# Patient Record
Sex: Female | Born: 1990
Health system: Southern US, Community
[De-identification: ages and names within clinical notes are randomized; demographics above are authoritative.]

## PROBLEM LIST (undated history)

## (undated) ENCOUNTER — Inpatient Hospital Stay (HOSPITAL_COMMUNITY): Payer: Self-pay

## (undated) DIAGNOSIS — E079 Disorder of thyroid, unspecified: Secondary | ICD-10-CM

## (undated) DIAGNOSIS — E039 Hypothyroidism, unspecified: Secondary | ICD-10-CM

## (undated) DIAGNOSIS — F419 Anxiety disorder, unspecified: Secondary | ICD-10-CM

## (undated) DIAGNOSIS — T7840XA Allergy, unspecified, initial encounter: Secondary | ICD-10-CM

## (undated) DIAGNOSIS — E786 Lipoprotein deficiency: Secondary | ICD-10-CM

## (undated) HISTORY — DX: Allergy, unspecified, initial encounter: T78.40XA

## (undated) HISTORY — DX: Anxiety disorder, unspecified: F41.9

## (undated) HISTORY — PX: CHOLECYSTECTOMY: SHX55

## (undated) HISTORY — DX: Hypothyroidism, unspecified: E03.9

---
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
## 2014-02-05 ENCOUNTER — Emergency Department (HOSPITAL_COMMUNITY)
Admission: EM | Admit: 2014-02-05 | Discharge: 2014-02-05 | Disposition: A | Discharge status: Home / Self Care | Payer: 59 | Source: Home / Self Care | Attending: Family Medicine | Admitting: Family Medicine

## 2014-02-05 ENCOUNTER — Encounter (HOSPITAL_COMMUNITY): Payer: Self-pay | Admitting: Emergency Medicine

## 2014-02-05 DIAGNOSIS — L03319 Cellulitis of trunk, unspecified: Secondary | ICD-10-CM

## 2014-02-05 DIAGNOSIS — L02219 Cutaneous abscess of trunk, unspecified: Secondary | ICD-10-CM

## 2014-02-05 DIAGNOSIS — T148 Other injury of unspecified body region: Secondary | ICD-10-CM

## 2014-02-05 DIAGNOSIS — W57XXXA Bitten or stung by nonvenomous insect and other nonvenomous arthropods, initial encounter: Secondary | ICD-10-CM

## 2014-02-05 HISTORY — DX: Disorder of thyroid, unspecified: E07.9

## 2014-02-05 MED ORDER — CEPHALEXIN 500 MG PO CAPS: 500.0000 mg | ORAL_CAPSULE | Freq: Two times a day (BID) | ORAL | Status: AC

## 2014-02-05 NOTE — ED Provider Notes (Signed)
Medical screening examination/treatment/procedure(s) were performed by non-physician practitioner and as supervising physician I was immediately available for consultation/collaboration.  Leslee Home, M.D.  Reuben Likes, MD 02/05/14 [id number]

## 2014-02-05 NOTE — ED Provider Notes (Signed)
CSN: [id number]     Arrival date & time 02/05/14  1739 History   First MD Initiated Contact with Patient 02/05/14 1748     Chief Complaint  Patient presents with  . Insect Bite   (Consider location/radiation/quality/duration/timing/severity/associated sxs/prior Treatment) HPI Comments: Patient presents with concerns of infection. She was bitten by something last week. In the last 2 days she has noticed worsening warmth and erythema around the area of the bite. Some tenderness. No swelling. No fever or chills.   The history is provided by the patient.    Past Medical History  Diagnosis Date  . Thyroid disease    History reviewed. No pertinent past surgical history. History reviewed. No pertinent family history. History  Substance Use Topics  . Smoking status: Never Smoker   . Smokeless tobacco: Not on file  . Alcohol Use: Yes   OB History   Grav Para Term Preterm Abortions TAB SAB Ect Mult Living                 Review of Systems  All other systems reviewed and are negative.   Allergies  Review of patient's allergies indicates no known allergies.  Home Medications   Prior to Admission medications   Medication Sig Start Date End Date Taking? Authorizing Provider  Thyroid (NATURE-THROID PO) Take by mouth.   Yes Historical Provider, MD  cephALEXin (KEFLEX) 500 MG capsule Take 1 capsule (500 mg total) by mouth 2 (two) times daily. 02/05/14 02/10/14  Riki Sheer, PA-C   BP 149/93  Pulse 85  Temp(Src) 97.9 F (36.6 C) (Oral)  Resp 16  SpO2 98%  LMP 01/15/2014 Physical Exam  Nursing note and vitals reviewed. Constitutional: She is oriented to person, place, and time. She appears well-developed and well-nourished. No distress.  HENT:  Head: Normocephalic and atraumatic.  Pulmonary/Chest: Effort normal.  Neurological: She is alert and oriented to person, place, and time.  Skin: Skin is warm. She is not diaphoretic.  6cm erythematous area surrounding bite mark, warm  to touch. No fluctuance or drainage. Mildly tender to palpaiton  Psychiatric: Her behavior is normal.    ED Course  Procedures (including critical care time) Labs Review Labs Reviewed - No data to display  Imaging Review No results found.   MDM   1. Cellulitis of trunk, unspecified site   2. Insect bite    MILD-Empiric coverage because of >4cm of erythema and warmth.  Cover with Keflex. Follow if worsens. Keep clean.    Riki Sheer, PA-C 02/05/14 1815

## 2014-02-05 NOTE — Discharge Instructions (Signed)

## 2014-02-05 NOTE — ED Notes (Signed)
Reports hiking last week.  noitced two days ago possible insect bite to right lower leg with redness and swelling.  Area kept clean and pt has been using neosporin.

## 2014-02-14 ENCOUNTER — Ambulatory Visit: Payer: Self-pay | Admitting: Family Medicine

## 2014-03-02 ENCOUNTER — Ambulatory Visit (INDEPENDENT_AMBULATORY_CARE_PROVIDER_SITE_OTHER): Payer: 59 | Admitting: Family Medicine

## 2014-03-02 ENCOUNTER — Encounter: Payer: Self-pay | Admitting: Family Medicine

## 2014-03-02 VITALS — BP 108/70 | HR 79 | Temp 97.2°F | Ht 65.75 in | Wt 187.6 lb

## 2014-03-02 DIAGNOSIS — Z7689 Persons encountering health services in other specified circumstances: Secondary | ICD-10-CM

## 2014-03-02 DIAGNOSIS — E119 Type 2 diabetes mellitus without complications: Secondary | ICD-10-CM

## 2014-03-02 DIAGNOSIS — F411 Generalized anxiety disorder: Secondary | ICD-10-CM

## 2014-03-02 DIAGNOSIS — E039 Hypothyroidism, unspecified: Secondary | ICD-10-CM

## 2014-03-02 DIAGNOSIS — Z7189 Other specified counseling: Secondary | ICD-10-CM

## 2014-03-02 LAB — HEMOGLOBIN A1C: Hgb A1c MFr Bld: 5.4 % (ref 4.6–6.5)

## 2014-03-02 NOTE — Progress Notes (Signed)
No chief complaint on file.   HPI:  Angela Miller is here to establish care.  Last PCP and physical:  Has the following chronic problems and concerns today:  There are no active problems to display for this patient.  Hypothyroidism: -mild, she has labs - TSH was in 5-6 range T4, T4 levels normal -had hair thinning, brittle nails, anxiety issues, fatigue - all improved on treatment -seen at [facility] -they started her on nature-thyroid- she is not sure what dose -FH of hypothyroid in mom, cousin and aunts  Very Mild Diabetes: - taking trichromium - working on diet and exercise  Anxiety: -generalized, does not tolerate change in schedule/etc well -social anxiety as well Restlessness, on edge:  sometimes Easily Fatigued: no Difficulty concentrating: occ Irritability: occ Muscle tension: constant Sleep Disturbance: yes, can't shut of brain -mom took xanax, she took this in highschool    ROS negative for unless reported above: fevers, unintentional weight loss, hearing or vision loss, chest pain, palpitations, struggling to breath, hemoptysis, melena, hematochezia, hematuria, falls, loc, si, thoughts of self harm  Past Medical History  Diagnosis Date  . Thyroid disease   . Hypothyroidism     Past Surgical History  Procedure Laterality Date  . Cholecystectomy  2009    Family History  Problem Relation Age of Onset  . Thyroid disease Mother   . Mental retardation Mother     depression  . Hypertension Father   . Thyroid disease Maternal Aunt   . Diabetes Maternal Grandmother   . Diabetes Maternal Grandfather   . Heart disease Maternal Grandfather   . Cancer Paternal Grandmother     lung    History   Social History  . Marital Status: Single    Spouse Name: N/A    Number of Children: N/A  . Years of Education: N/A   Social History Main Topics  . Smoking status: Never Smoker   . Smokeless tobacco: None  . Alcohol Use: Yes  . Drug  Use: No  . Sexual Activity: None   Other Topics Concern  . None   Social History Narrative   Work or School: Engineer, civil (consulting)nurse, Education officer, environmentalalamance      Home Situation: lives with spouse      Spiritual Beliefs: none      Lifestyle: regular exercise, healthy diet                Current outpatient prescriptions:NON FORMULARY, daily. Trichromium, Disp: , Rfl: ;  NON FORMULARY, Pregnololone, Disp: , Rfl: ;  Thyroid (NATURE-THROID PO), Take by mouth., Disp: , Rfl: ;  VITAMIN D, ERGOCALCIFEROL, PO, Take by mouth daily., Disp: , Rfl:   EXAM:  Filed Vitals:   03/02/14 1410  BP: 108/70  Pulse: 79  Temp: 97.2 F (36.2 C)    Body mass index is 30.51 kg/(m^2).  GENERAL: vitals reviewed and listed above, alert, oriented, appears well hydrated and in no acute distress  HEENT: atraumatic, conjunttiva clear, no obvious abnormalities on inspection of external nose and ears  NECK: no obvious masses on inspection  LUNGS: clear to auscultation bilaterally, no wheezes, rales or rhonchi, good air movement  CV: HRRR, no peripheral edema  MS: moves all extremities without noticeable abnormality  PSYCH: pleasant and cooperative, no obvious depression or anxiety  ASSESSMENT AND PLAN:  Discussed the following assessment and plan:  Hypothyroidism, unspecified hypothyroidism type - Plan: TSH  Type 2 diabetes mellitus without complication - Plan: Hemoglobin A1c  Encounter to establish care  Generalized anxiety disorder  -check TSH, try levothyroxine low dose, follow up 6 weeks -counsleing, discussed meds, she may try SSRI but wants to try counseling first, advised I don't rx benzos -We reviewed the PMH, PSH, FH, SH, Meds and Allergies. -We provided refills for any medications we will prescribe as needed. -We addressed current concerns per orders and patient instructions. -We have asked for records for pertinent exams, studies, vaccines and notes from previous providers. -We have advised patient to  follow up per instructions below.   -Patient advised to return or notify a doctor immediately if symptoms worsen or persist or new concerns arise.  There are no Patient Instructions on file for this visit.   Kriste BasqueKIM, Angela Sinning R.

## 2014-03-02 NOTE — Patient Instructions (Signed)
BEFORE YOU LEAVE: -labs -schedule follow up in 6 weeks  Get counseling  -We have ordered labs or studies at this visit. It can take up to 1-2 weeks for results and processing. We will contact you with instructions IF your results are abnormal. Normal results will be released to your [facility]. If you have not heard from us or can not find your results in [facility] in 2 weeks please contact our office.  -PLEASE SIGN UP FOR MYCHART TODAY   We recommend the following healthy lifestyle measures: - eat a healthy diet consisting of lots of vegetables, fruits, beans, nuts, seeds, healthy meats such as white chicken and fish and whole grains.  - avoid fried foods, fast food, processed foods, sodas, red meet and other fattening foods.  - get a least 150 minutes of aerobic exercise per week.

## 2014-03-02 NOTE — Progress Notes (Signed)
Pre visit review using our clinic review tool, if applicable. No additional management support is needed unless otherwise documented below in the visit note. 

## 2014-03-03 ENCOUNTER — Telehealth: Payer: Self-pay | Admitting: Family Medicine

## 2014-03-03 LAB — TSH: TSH: 2.9 u[IU]/mL (ref 0.35–4.50)

## 2014-03-03 MED ORDER — LEVOTHYROXINE SODIUM 50 MCG PO TABS: 50.0000 ug | ORAL_TABLET | Freq: Every day | ORAL | Status: DC

## 2014-03-03 NOTE — Telephone Encounter (Signed)
Patient informed and she is aware the Rx was sent to her pharmacy and stated she will have the lab test done at her next appt with Dr Selena BattenKim.  She was informed of the HGA1C level also.

## 2014-03-03 NOTE — Telephone Encounter (Signed)
Please send synthroid to take on empty stomach daily if wants to try the synthroid INSTEAD of the NATURETHROID. Would advise recheck TSH in 6 weeks. Send 30 with 3 refills.

## 2014-03-03 NOTE — Telephone Encounter (Signed)
Pt  States Dr. Selena BattenKim was suppose to send a RX in for synthroid but nothing was sent and pt would like to know what her A1C lab results are.  [facility] - [place], [place] - [address]

## 2014-04-13 ENCOUNTER — Ambulatory Visit (INDEPENDENT_AMBULATORY_CARE_PROVIDER_SITE_OTHER): Payer: 59 | Admitting: Family Medicine

## 2014-04-13 ENCOUNTER — Encounter: Payer: Self-pay | Admitting: Family Medicine

## 2014-04-13 VITALS — BP 100/60 | HR 80 | Temp 98.1°F | Ht 65.75 in | Wt 187.0 lb

## 2014-04-13 DIAGNOSIS — E039 Hypothyroidism, unspecified: Secondary | ICD-10-CM

## 2014-04-13 DIAGNOSIS — F411 Generalized anxiety disorder: Secondary | ICD-10-CM

## 2014-04-13 DIAGNOSIS — Z8639 Personal history of other endocrine, nutritional and metabolic disease: Secondary | ICD-10-CM

## 2014-04-13 LAB — TSH: TSH: 3.09 u[IU]/mL (ref 0.35–4.50)

## 2014-04-13 NOTE — Progress Notes (Signed)
HPI:  There are no active problems to display for this patient.  Hypothyroidism: -mild, she has labs - TSH was in 5-6 range T4, T4 levels normal - TSH normal on recheck, transitioning to low dose levothyroxine reports doing great and feels well -seen at [facility] in the past and they started her on nature-thyroid -denies: skin, nail issues, constipation, palpitations, decreased energy -FH of hypothyroid in mom, cousin and aunts  ? Hx Prediabetes: - taking trichromium -HgbA1c 5.4 on our check - working on diet and exercise No polyuria or polydipsia  Anxiety: -generalized, does not tolerate change in schedule/etc well -social anxiety as well -discussed options for tx and she opted for counseling first and possibly SSRI -reports: she is actually doing pretty well, is still planning to do counseling Restlessness, on edge: sometimes Easily Fatigued: no Difficulty concentrating: occ Irritability: occ Muscle tension: improving Sleep Disturbance: yes, can't shut of brain -mom took xanax, she took this in highschool   ROS: See pertinent positives and negatives per HPI.  Past Medical History  Diagnosis Date  . Thyroid disease   . Hypothyroidism     Past Surgical History  Procedure Laterality Date  . Cholecystectomy  2009    Family History  Problem Relation Age of Onset  . Thyroid disease Mother   . Mental retardation Mother     depression  . Hypertension Father   . Thyroid disease Maternal Aunt   . Diabetes Maternal Grandmother   . Diabetes Maternal Grandfather   . Heart disease Maternal Grandfather   . Cancer Paternal Grandmother     lung    History   Social History  . Marital Status: Single    Spouse Name: N/A    Number of Children: N/A  . Years of Education: N/A   Social History Main Topics  . Smoking status: Never Smoker   . Smokeless tobacco: None  . Alcohol Use: Yes  . Drug Use: No  . Sexual Activity: None   Other Topics  Concern  . None   Social History Narrative   Work or School: Engineer, civil (consulting)nurse, Education officer, environmentalalamance      Home Situation: lives with spouse      Spiritual Beliefs: none      Lifestyle: regular exercise, healthy diet                Current outpatient prescriptions: levothyroxine (SYNTHROID, LEVOTHROID) 50 MCG tablet, Take 1 tablet (50 mcg total) by mouth daily before breakfast., Disp: 30 tablet, Rfl: 3;  NON FORMULARY, daily. Trichromium, Disp: , Rfl: ;  NON FORMULARY, Pregnololone, Disp: , Rfl: ;  VITAMIN D, ERGOCALCIFEROL, PO, Take by mouth daily., Disp: , Rfl:   EXAM:  Filed Vitals:   04/13/14 1335  BP: 100/60  Pulse: 80  Temp: 98.1 F (36.7 C)    Body mass index is 30.41 kg/(m^2).  GENERAL: vitals reviewed and listed above, alert, oriented, appears well hydrated and in no acute distress  HEENT: atraumatic, conjunttiva clear, no obvious abnormalities on inspection of external nose and ears  NECK: no obvious masses on inspection  LUNGS: clear to auscultation bilaterally, no wheezes, rales or rhonchi, good air movement  CV: HRRR, no peripheral edema  MS: moves all extremities without noticeable abnormality  PSYCH: pleasant and cooperative, no obvious depression or anxiety  ASSESSMENT AND PLAN:  Discussed the following assessment and plan:  Hypothyroidism, unspecified hypothyroidism type - Plan: TSH  Generalized anxiety disorder  Hx of hyperglycemia  -TSH today -lifestyle recs -follow  up in 3 months -Patient advised to return or notify a doctor immediately if symptoms worsen or persist or new concerns arise.  Patient Instructions  BEFORE YOU LEAVE: -labs  -try to get the counseling       Angela Miller, Angela ClientHANNAH R.

## 2014-04-13 NOTE — Patient Instructions (Signed)
BEFORE YOU LEAVE: -labs  -try to get the counseling

## 2014-04-13 NOTE — Progress Notes (Signed)
Pre visit review using our clinic review tool, if applicable. No additional management support is needed unless otherwise documented below in the visit note. 

## 2014-05-27 ENCOUNTER — Emergency Department
Admission: EM | Admit: 2014-05-27 | Discharge: 2014-05-27 | Disposition: A | Discharge status: Home / Self Care | Payer: 59 | Source: Home / Self Care | Attending: Family Medicine | Admitting: Family Medicine

## 2014-05-27 ENCOUNTER — Encounter: Payer: Self-pay | Admitting: Emergency Medicine

## 2014-05-27 DIAGNOSIS — N3090 Cystitis, unspecified without hematuria: Secondary | ICD-10-CM

## 2014-05-27 HISTORY — DX: Lipoprotein deficiency: E78.6

## 2014-05-27 LAB — POCT URINALYSIS DIP (MANUAL ENTRY)
Bilirubin, UA: NEGATIVE
Glucose, UA: NEGATIVE
Ketones, POC UA: NEGATIVE
NITRITE UA: NEGATIVE
PROTEIN UA: NEGATIVE
RBC UA: NEGATIVE
Spec Grav, UA: 1.02 (ref 1.005–1.03)
UROBILINOGEN UA: 0.2 (ref 0–1)
pH, UA: 5.5 (ref 5–8)

## 2014-05-27 MED ORDER — NITROFURANTOIN MONOHYD MACRO 100 MG PO CAPS: 100.0000 mg | ORAL_CAPSULE | Freq: Two times a day (BID) | ORAL | Status: DC

## 2014-05-27 NOTE — ED Notes (Signed)
Pt c/o urinary frequency, urgency and burning x2 days. States no bleeding or back pain. States she has hx of frequent UTI.

## 2014-05-27 NOTE — Discharge Instructions (Signed)
Increase fluid intake.  Try taking cranberry pills for prevention.   Urinary Tract Infection Urinary tract infections (UTIs) can develop anywhere along your urinary tract. Your urinary tract is your body's drainage system for removing wastes and extra water. Your urinary tract includes two kidneys, two ureters, a bladder, and a urethra. Your kidneys are a pair of bean-shaped organs. Each kidney is about the size of your fist. They are located below your ribs, one on each side of your spine. CAUSES Infections are caused by microbes, which are microscopic organisms, including fungi, viruses, and bacteria. These organisms are so small that they can only be seen through a microscope. Bacteria are the microbes that most commonly cause UTIs. SYMPTOMS  Symptoms of UTIs may vary by age and gender of the patient and by the location of the infection. Symptoms in young women typically include a frequent and intense urge to urinate and a painful, burning feeling in the bladder or urethra during urination. Older women and men are more likely to be tired, shaky, and weak and have muscle aches and abdominal pain. A fever may mean the infection is in your kidneys. Other symptoms of a kidney infection include pain in your back or sides below the ribs, nausea, and vomiting. DIAGNOSIS To diagnose a UTI, your caregiver will ask you about your symptoms. Your caregiver also will ask to provide a urine sample. The urine sample will be tested for bacteria and white blood cells. White blood cells are made by your body to help fight infection. TREATMENT  Typically, UTIs can be treated with medication. Because most UTIs are caused by a bacterial infection, they usually can be treated with the use of antibiotics. The choice of antibiotic and length of treatment depend on your symptoms and the type of bacteria causing your infection. HOME CARE INSTRUCTIONS  If you were prescribed antibiotics, take them exactly as your caregiver  instructs you. Finish the medication even if you feel better after you have only taken some of the medication.  Drink enough water and fluids to keep your urine clear or pale yellow.  Avoid caffeine, tea, and carbonated beverages. They tend to irritate your bladder.  Empty your bladder often. Avoid holding urine for long periods of time.  Empty your bladder before and after sexual intercourse.  After a bowel movement, women should cleanse from front to back. Use each tissue only once. SEEK MEDICAL CARE IF:   You have back pain.  You develop a fever.  Your symptoms do not begin to resolve within 3 days. SEEK IMMEDIATE MEDICAL CARE IF:   You have severe back pain or lower abdominal pain.  You develop chills.  You have nausea or vomiting.  You have continued burning or discomfort with urination. MAKE SURE YOU:   Understand these instructions.  Will watch your condition.  Will get help right away if you are not doing well or get worse. Document Released: 02/05/2005 Document Revised: 10/28/2011 Document Reviewed: 06/06/2011 [facility] Patient Information 2015 [place], [place]. This information is not intended to replace advice given to you by your health care provider. Make sure you discuss any questions you have with your health care provider.

## 2014-05-27 NOTE — ED Provider Notes (Signed)
CSN: [id number]     Arrival date & time 05/27/14  1209 History   First MD Initiated Contact with Patient 05/27/14 1228     Chief Complaint  Patient presents with  . Urinary Tract Infection      HPI Comments: Patient complains of five day history of dysuria, frequency, nocturia, and urgency.  She has a history of recurring UTI's.  About four months ago she was treated for a UTI with amoxicillin.  No fevers, chills, and sweats.  No pelvic or abdominal pain.  Patient is a 24 y.o. female presenting with dysuria. The history is provided by the patient.  Dysuria Pain quality:  Burning Pain severity:  Mild Onset quality:  Sudden Duration:  2 days Timing:  Constant Progression:  Worsening Chronicity:  Recurrent Recent urinary tract infections: no   Relieved by:  Nothing Worsened by:  Nothing tried Ineffective treatments:  Cranberry juice Urinary symptoms: frequent urination and hesitancy   Urinary symptoms: no discolored urine, no foul-smelling urine, no hematuria and no bladder incontinence   Associated symptoms: no abdominal pain, no fever, no flank pain, no genital lesions, no nausea, no vaginal discharge and no vomiting   Risk factors: recurrent urinary tract infections     Past Medical History  Diagnosis Date  . Hypolipidemia    Past Surgical History  Procedure Laterality Date  . Cholecystectomy     History reviewed. No pertinent family history. History  Substance Use Topics  . Smoking status: Never Smoker   . Smokeless tobacco: Not on file  . Alcohol Use: 1.2 oz/week    2 Glasses of wine per week   OB History    No data available     Review of Systems  Constitutional: Negative for fever.  Gastrointestinal: Negative for nausea, vomiting and abdominal pain.  Genitourinary: Positive for dysuria. Negative for flank pain and vaginal discharge.  All other systems reviewed and are negative.   Allergies  Review of patient's allergies indicates not on file.  Home  Medications   Prior to Admission medications   Medication Sig Start Date End Date Taking? Authorizing Provider  levothyroxine (SYNTHROID, LEVOTHROID) 100 MCG tablet Take 100 mcg by mouth daily before breakfast.   Yes Historical Provider, MD  nitrofurantoin, macrocrystal-monohydrate, (MACROBID) 100 MG capsule Take 1 capsule (100 mg total) by mouth 2 (two) times daily. Take with food. 05/27/14   Lattie HawStephen A Myquan Schaumburg, MD   BP 130/88 mmHg  Pulse 96  Temp(Src) 98.4 F (36.9 C) (Oral)  Ht 5\' 5"  (1.651 m)  Wt 185 lb (83.915 kg)  BMI 30.79 kg/m2  SpO2 97%  LMP 04/29/2014 (Exact Date) Physical Exam Nursing notes and Vital Signs reviewed. Appearance:  Patient appears stated age, and in no acute distress. Patient is obese (BMI 30.8) Eyes:  Pupils are equal, round, and reactive to light and accomodation.  Extraocular movement is intact.  Conjunctivae are not inflamed   Pharynx:  Normal Neck:  Supple.  No adenopathy Lungs:  Clear to auscultation.  Breath sounds are equal.  Heart:  Regular rate and rhythm without murmurs, rubs, or gallops.  Abdomen:  Nontender without masses or hepatosplenomegaly.  Bowel sounds are present.  No CVA or flank tenderness.  Extremities:  No edema.  No calf tenderness Skin:  No rash present.   ED Course  Procedures none    Labs Reviewed  URINE CULTURE  POCT URINALYSIS DIP (MANUAL ENTRY):  SG >= 1.030; LEU trace; otherwise negative      MDM  1. Cystitis    Urine culture pending. Begin Macrobid  BID for one week. Increase fluid intake.  Try taking cranberry pills for prevention. Followup with Family Doctor if not improved in one week.     Lattie Haw, MD 05/29/14 1003

## 2014-05-30 LAB — URINE CULTURE

## 2014-05-31 ENCOUNTER — Telehealth: Payer: Self-pay | Admitting: Emergency Medicine

## 2014-06-06 ENCOUNTER — Encounter: Payer: Self-pay | Admitting: Family Medicine

## 2015-01-07 ENCOUNTER — Ambulatory Visit (INDEPENDENT_AMBULATORY_CARE_PROVIDER_SITE_OTHER): Payer: 59 | Admitting: Family Medicine

## 2015-01-07 VITALS — BP 126/80 | HR 97 | Temp 98.4°F | Resp 20 | Ht 65.5 in | Wt 187.0 lb

## 2015-01-07 DIAGNOSIS — Z3401 Encounter for supervision of normal first pregnancy, first trimester: Secondary | ICD-10-CM

## 2015-01-07 DIAGNOSIS — Z3200 Encounter for pregnancy test, result unknown: Secondary | ICD-10-CM | POA: Diagnosis not present

## 2015-01-07 DIAGNOSIS — R300 Dysuria: Secondary | ICD-10-CM

## 2015-01-07 DIAGNOSIS — N390 Urinary tract infection, site not specified: Secondary | ICD-10-CM

## 2015-01-07 LAB — POCT URINALYSIS DIPSTICK
Bilirubin, UA: NEGATIVE
Glucose, UA: NEGATIVE
KETONES UA: NEGATIVE
LEUKOCYTES UA: NEGATIVE
Nitrite, UA: NEGATIVE
PROTEIN UA: NEGATIVE
Spec Grav, UA: 1.015
Urobilinogen, UA: 0.2
pH, UA: 6

## 2015-01-07 LAB — POCT UA - MICROSCOPIC ONLY
CASTS, UR, LPF, POC: NEGATIVE
Crystals, Ur, HPF, POC: NEGATIVE
Mucus, UA: NEGATIVE
YEAST UA: NEGATIVE

## 2015-01-07 MED ORDER — NITROFURANTOIN MONOHYD MACRO 100 MG PO CAPS: 100.0000 mg | ORAL_CAPSULE | Freq: Two times a day (BID) | ORAL | Status: DC

## 2015-01-07 NOTE — Patient Instructions (Signed)

## 2015-01-07 NOTE — Progress Notes (Signed)
This chart was scribed for Elvina Sidle, MD by [facility], medical scribe at [facility]The patient was seen in exam room 02 and the patient's care was started at 11:41 AM.  Patient ID: Angela Miller MRN: [id number], DOB: 1990/10/20, 24 y.o. Date of Encounter: 01/07/2015  Primary Physician: No PCP Per Patient  Chief Complaint:  Chief Complaint  Patient presents with   Urinary Tract Infection    UTI that comes and goes x 2 wks.  about [redacted] wks pregnant. burning and frequency with urination   HPI:  Angela Miller is a 24 y.o. female who presents to [facility] complaining of dysuria, urinary frequency and urinary discoloration for two weeks. Associated nausea  History of UTI, more than 2-3 a year. [redacted] weeks pregnant, positive home pregnancy test.  LNMP was July 19 th. Sees [facility] [employer]. Denies back pain, fever and chills.  Past Medical History  Diagnosis Date   Thyroid disease    Hypothyroidism    Hypolipidemia    Allergy    Anxiety     Home Meds: Prior to Admission medications   Medication Sig Start Date End Date Taking? Authorizing Provider  levothyroxine (SYNTHROID, LEVOTHROID) 100 MCG tablet Take 100 mcg by mouth daily before breakfast.    Historical Provider, MD  levothyroxine (SYNTHROID, LEVOTHROID) 50 MCG tablet Take 1 tablet (50 mcg total) by mouth daily before breakfast. 03/03/14   Terressa Koyanagi, DO  nitrofurantoin, macrocrystal-monohydrate, (MACROBID) 100 MG capsule Take 1 capsule (100 mg total) by mouth 2 (two) times daily. Take with food. 05/27/14   Lattie Haw, MD  NON FORMULARY daily. Trichromium    Historical Provider, MD  NON FORMULARY Pregnololone    Historical Provider, MD  VITAMIN D, ERGOCALCIFEROL, PO Take by mouth daily.    Historical Provider, MD   Allergies:  Allergies  Allergen Reactions   Other     Diary products-GI upset   Social History   Social History   Marital Status: Single   Spouse Name: N/A   Number of Children: N/A   Years of Education: N/A   Occupational History   Not on file.   Social History Main Topics   Smoking status: Never Smoker    Smokeless tobacco: Not on file   Alcohol Use: 1.2 oz/week    2 Glasses of wine per week   Drug Use: No   Sexual Activity: Not on file   Other Topics Concern   Not on file   Social History Narrative   ** Merged History Encounter **       Work or School: Engineer, civil (consulting), Film/video editor      Home Situation: lives with spouse      Spiritual Beliefs: none      Lifestyle: regular exercise, healthy diet                Review of Systems: Constitutional: negative for chills, fever, night sweats, weight changes, or fatigue  HEENT: negative for vision changes, hearing loss, congestion, rhinorrhea, ST, epistaxis, or sinus pressure Cardiovascular: negative for chest pain or palpitations Respiratory: negative for hemoptysis, wheezing, shortness of breath, or cough Abdominal: negative for abdominal pain, vomiting, diarrhea, or constipation. Positive for nausea. Genitourinary: Dysuria, urinary frequency.  Dermatological: negative for rash Neurologic: negative for headache, dizziness, or syncope All other systems reviewed and are otherwise negative with the exception to those above and in the HPI.  Physical Exam: Blood pressure 126/80, pulse 97,  temperature 98.4 F (36.9 C), temperature source Oral, resp. rate 20, height 5' 5.5" (1.664 m), weight 187 lb (84.823 kg), last menstrual period 04/29/2014, SpO2 98 %., Body mass index is 30.63 kg/(m^2). General: Well developed, well nourished, in no acute distress. Head: Normocephalic, atraumatic, eyes without discharge, sclera non-icteric, nares are without discharge. Bilateral auditory canals clear, TM's are without perforation, pearly grey and translucent with reflective cone of light bilaterally. Oral cavity moist, posterior pharynx without exudate, erythema, peritonsillar  abscess, or post nasal drip.  Neck: Supple. No thyromegaly. Full ROM. No lymphadenopathy. Lungs: Clear bilaterally to auscultation without wheezes, rales, or rhonchi. Breathing is unlabored. Heart: RRR with S1 S2. No murmurs, rubs, or gallops appreciated. Abdomen: Soft, non-tender, non-distended with normoactive bowel sounds. No hepatomegaly. No rebound/guarding. No obvious abdominal masses. Msk:  Strength and tone normal for age. Extremities/Skin: Warm and dry. No clubbing or cyanosis. No edema. No rashes or suspicious lesions. Neuro: Alert and oriented X 3. Moves all extremities spontaneously. Gait is normal. CNII-XII grossly in tact. Psych:  Responds to questions appropriately with a normal affect.   Labs: Results for orders placed or performed during the hospital encounter of 05/27/14  Urine culture  Result Value Ref Range   Culture ESCHERICHIA COLI    Colony Count >=100,000 COLONIES/ML    Organism ID, Bacteria ESCHERICHIA COLI       Susceptibility   Escherichia coli -  (no method available)    AMPICILLIN >=32 Resistant     AMOX/CLAVULANIC 16 Intermediate     AMPICILLIN/SULBACTAM >=32 Resistant     PIP/TAZO 64 Intermediate     IMIPENEM <=0.25 Sensitive     CEFAZOLIN 8 Sensitive     CEFTRIAXONE <=1 Sensitive     CEFTAZIDIME <=1 Sensitive     CEFEPIME <=1 Sensitive     GENTAMICIN <=1 Sensitive     TOBRAMYCIN <=1 Sensitive     CIPROFLOXACIN <=0.25 Sensitive     LEVOFLOXACIN <=0.12 Sensitive     NITROFURANTOIN <=16 Sensitive     TRIMETH/SULFA <=20 Sensitive   POCT urinalysis dipstick (new)  Result Value Ref Range   Color, UA yellow    Clarity, UA cloudy    Glucose, UA neg    Bilirubin, UA negative    Ketones, POC UA negative    Spec Grav, UA 1.020 1.005 - 1.03   Blood, UA negative    pH, UA 5.5 5 - 8   Protein Ur, POC negative    Urobilinogen, UA 0.2 0 - 1   Nitrite, UA Negative    Leukocytes, UA Trace      ASSESSMENT AND PLAN:  24 y.o. year old female with   1.  Dysuria    Signed, Elvina Sidle, MD 01/07/2015 11:41 AM

## 2015-01-07 NOTE — Addendum Note (Signed)
Addended by: Laurey Arrow on: 01/07/2015 12:10 PM   Modules accepted: Orders

## 2015-01-09 LAB — URINE CULTURE: Colony Count: >=100000

## 2015-02-24 ENCOUNTER — Inpatient Hospital Stay (HOSPITAL_COMMUNITY)
Admission: AD | Admit: 2015-02-24 | Discharge: 2015-02-24 | Disposition: A | Discharge status: Home / Self Care | Payer: 59 | Source: Ambulatory Visit | Attending: Obstetrics and Gynecology | Admitting: Obstetrics and Gynecology

## 2015-02-24 ENCOUNTER — Inpatient Hospital Stay (HOSPITAL_COMMUNITY): Payer: 59

## 2015-02-24 ENCOUNTER — Encounter (HOSPITAL_COMMUNITY): Payer: Self-pay | Admitting: *Deleted

## 2015-02-24 DIAGNOSIS — O4690 Antepartum hemorrhage, unspecified, unspecified trimester: Secondary | ICD-10-CM

## 2015-02-24 DIAGNOSIS — O200 Threatened abortion: Secondary | ICD-10-CM | POA: Diagnosis not present

## 2015-02-24 DIAGNOSIS — R109 Unspecified abdominal pain: Secondary | ICD-10-CM | POA: Diagnosis present

## 2015-02-24 DIAGNOSIS — Z3A01 Less than 8 weeks gestation of pregnancy: Secondary | ICD-10-CM | POA: Diagnosis not present

## 2015-02-24 LAB — CBC
HCT: 34.9 % — ABNORMAL LOW (ref 36.0–46.0)
Hemoglobin: 11.6 g/dL — ABNORMAL LOW (ref 12.0–15.0)
MCH: 29 pg (ref 26.0–34.0)
MCHC: 33.2 g/dL (ref 30.0–36.0)
MCV: 87.3 fL (ref 78.0–100.0)
Platelets: 294 10*3/uL (ref 150–400)
RBC: 4 MIL/uL (ref 3.87–5.11)
RDW: 12.8 % (ref 11.5–15.5)
WBC: 13.2 10*3/uL — AB (ref 4.0–10.5)

## 2015-02-24 LAB — HCG, QUANTITATIVE, PREGNANCY: hCG, Beta Chain, Quant, S: 11298 m[IU]/mL — ABNORMAL HIGH (ref <5)

## 2015-02-24 LAB — POCT PREGNANCY, URINE: PREG TEST UR: POSITIVE — AB

## 2015-02-24 MED ORDER — OXYCODONE-ACETAMINOPHEN 5-325 MG PO TABS: 2.0000 | ORAL_TABLET | Freq: Once | ORAL | Status: DC

## 2015-02-24 MED ORDER — OXYCODONE-ACETAMINOPHEN 5-325 MG PO TABS: 2.0000 | ORAL_TABLET | Freq: Once | ORAL | Status: AC

## 2015-02-24 NOTE — MAU Note (Signed)
Pregnancy Test Positive. Urine Sent to Lab @1432 .

## 2015-02-24 NOTE — MAU Note (Signed)
Patient presents stating [facility] following a positive HPT on 12/25/14 and verification of the pregnancy by that office but her quant's have been dropping and her last office U/S showed she was only measuring [redacted] weeks gestation. States she started bleeding a week ago (brown discharge) that has not stopped and is now passing clots and cramping.

## 2015-02-24 NOTE — Discharge Instructions (Signed)

## 2015-02-24 NOTE — MAU Provider Note (Signed)
Angela Miller is a 24 y.o. G1P0 at 6.5 weeks but based on there EDD she was 12 weeks.  She c/o VB, clots and cramping. Quant on 10/10 22671.1, quant on 10/2 16109.1.  Pt believes she is having a miscarriage.   History     There are no active problems to display for this patient.   Chief Complaint  Patient presents with  . Threatened Miscarriage   HPI  OB History    Gravida Para Term Preterm AB TAB SAB Ectopic Multiple Living   1 0 0 0 0 0 0 0  0      Past Medical History  Diagnosis Date  . Thyroid disease   . Hypothyroidism   . Hypolipidemia   . Allergy   . Anxiety     Past Surgical History  Procedure Laterality Date  . Cholecystectomy  2009  . Cholecystectomy      Family History  Problem Relation Age of Onset  . Thyroid disease Mother   . Mental retardation Mother     depression  . Hypertension Father   . Thyroid disease Maternal Aunt   . Diabetes Maternal Grandmother   . Diabetes Maternal Grandfather   . Heart disease Maternal Grandfather   . Cancer Paternal Grandmother     lung    Social History  Substance Use Topics  . Smoking status: Never Smoker   . Smokeless tobacco: None  . Alcohol Use: 1.2 oz/week    2 Glasses of wine per week    Allergies:  Allergies  Allergen Reactions  . Other     Diary products-GI upset    Prescriptions prior to admission  Medication Sig Dispense Refill Last Dose  . nitrofurantoin, macrocrystal-monohydrate, (MACROBID) 100 MG capsule Take 1 capsule (100 mg total) by mouth 2 (two) times daily. Take with food. 14 capsule 0   . Prenatal Vit-Fe Fumarate-FA (MULTIVITAMIN-PRENATAL) 27-0.8 MG TABS tablet Take 1 tablet by mouth daily at 12 noon.   Taking    ROS See HPI above, all other systems are negative  Physical Exam   Blood pressure 139/90, pulse 93, temperature 98.4 F (36.9 C), temperature source Oral, resp. rate 16, height  (1.651 m), weight 185 lb (83.915 kg), last menstrual period  11/27/2013.  Physical Exam Ext:  WNL ABD: Soft, non tender to palpation, no rebound or guarding SVE: deferred   ED Course  Assessment: IUP at  6.5weeks Suspected miscarriage  Plan:  Labs: CBC, quant,  [place]  Tyresa Prindiville, CNM, MSN 02/24/2015. 3:40 PM   MAU Addendum Note  Results for orders placed or performed during the hospital encounter of 02/24/15 (from the past 24 hour(s))  Pregnancy, urine POC     Status: Abnormal   Collection Time: 02/24/15  2:31 PM  Result Value Ref Range   Preg Test, Ur POSITIVE (A) NEGATIVE  CBC     Status: Abnormal   Collection Time: 02/24/15  3:29 PM  Result Value Ref Range   WBC 13.2 (H) 4.0 - 10.5 K/uL   RBC 4.00 3.87 - 5.11 MIL/uL   Hemoglobin 11.6 (L) 12.0 - 15.0 g/dL   HCT 60.4 (L) 54.0 - 98.1 %   MCV 87.3 78.0 - 100.0 fL   MCH 29.0 26.0 - 34.0 pg   MCHC 33.2 30.0 - 36.0 g/dL   RDW 19.1 47.8 - 29.5 %   Platelets 294 150 - 400 K/uL   Results for orders placed or performed during the hospital encounter of 02/24/15 (from the  past 24 hour(s))  Pregnancy, urine POC     Status: Abnormal   Collection Time: 02/24/15  2:31 PM  Result Value Ref Range   Preg Test, Ur POSITIVE (A) NEGATIVE  CBC     Status: Abnormal   Collection Time: 02/24/15  3:29 PM  Result Value Ref Range   WBC 13.2 (H) 4.0 - 10.5 K/uL   RBC 4.00 3.87 - 5.11 MIL/uL   Hemoglobin 11.6 (L) 12.0 - 15.0 g/dL   HCT 40.934.9 (L) 81.136.0 - 91.446.0 %   MCV 87.3 78.0 - 100.0 fL   MCH 29.0 26.0 - 34.0 pg   MCHC 33.2 30.0 - 36.0 g/dL   RDW 78.212.8 95.611.5 - 21.315.5 %   Platelets 294 150 - 400 K/uL  hCG, quantitative, pregnancy     Status: Abnormal   Collection Time: 02/24/15  3:29 PM  Result Value Ref Range   hCG, Beta Chain, Quant, S 11298 (H) <5 mIU/mL   US FINDINGS: Intrauterine gestational sac: Elongated gestational sac noted in the uterus extending from the mid uterine segment to just above the endocervix. There is a thin amnion within the gestational sac.  Yolk sac: No Embryo:  No MSD: 21.5 mm 7 w 1 d  Maternal uterus/adnexae: There is evidence of a small subchronic hemorrhage. No uterine masses. Cervix is normal in length and closed. Normal ovaries. No adnexal masses. No pelvic free fluid.  IMPRESSION: 1. Abnormal elongated gestational sac lies within the uterus containing no yolk sac or embryo. Findings are suspicious but not yet definitive for failed pregnancy. Recommend follow-up US in 10-14 days for definitive diagnosis. This recommendation follows SRU consensus guidelines: Diagnostic Criteria for Nonviable Pregnancy Early in the First Trimester. Malva Limes Engl J Med 2013; 086:5784-69; 369:1443-51. 2. Small subchronic hemorrhage. 3. No uterine masses. Normal ovaries and adnexa.  Assessment: Suspected miscarriage   Pt given 3 options 1) Expectant management 2) Active management cytotec 3) D&E 4) Allow time to think about the choice   Plan: -Pt and husband wants to think and decide later -Percocet prior to DC -Rx for percocet -Discussed need to follow up in office early next week -Bleeding Precautions -Encouraged to call if any questions or concerns arise prior to next scheduled office visit.  -Discharged to home in stable condition    Cletus Paris, CNM, MSN 02/24/2015. 4:17 PM

## 2015-05-30 MED FILL — LEVOTHYROXINE 88 MCG TABLET: 88 | 90 days supply | Qty: 90 | Fill #0

## 2015-06-01 DIAGNOSIS — Z3200 Encounter for pregnancy test, result unknown: Secondary | ICD-10-CM | POA: Diagnosis not present

## 2015-06-01 DIAGNOSIS — Z3201 Encounter for pregnancy test, result positive: Secondary | ICD-10-CM | POA: Diagnosis not present

## 2015-06-04 DIAGNOSIS — Z8759 Personal history of other complications of pregnancy, childbirth and the puerperium: Secondary | ICD-10-CM | POA: Diagnosis not present

## 2015-06-04 DIAGNOSIS — E039 Hypothyroidism, unspecified: Secondary | ICD-10-CM | POA: Diagnosis not present

## 2015-06-04 DIAGNOSIS — Z331 Pregnant state, incidental: Secondary | ICD-10-CM | POA: Diagnosis not present

## 2015-06-13 DIAGNOSIS — Z8759 Personal history of other complications of pregnancy, childbirth and the puerperium: Secondary | ICD-10-CM | POA: Diagnosis not present

## 2015-06-14 DIAGNOSIS — Z3A01 Less than 8 weeks gestation of pregnancy: Secondary | ICD-10-CM | POA: Diagnosis not present

## 2015-06-14 DIAGNOSIS — O3680X1 Pregnancy with inconclusive fetal viability, fetus 1: Secondary | ICD-10-CM | POA: Diagnosis not present

## 2015-06-14 DIAGNOSIS — Z8759 Personal history of other complications of pregnancy, childbirth and the puerperium: Secondary | ICD-10-CM | POA: Diagnosis not present

## 2015-06-14 DIAGNOSIS — Z3201 Encounter for pregnancy test, result positive: Secondary | ICD-10-CM | POA: Diagnosis not present

## 2015-06-27 DIAGNOSIS — Z3481 Encounter for supervision of other normal pregnancy, first trimester: Secondary | ICD-10-CM | POA: Diagnosis not present

## 2015-06-27 DIAGNOSIS — Z3A01 Less than 8 weeks gestation of pregnancy: Secondary | ICD-10-CM | POA: Diagnosis not present

## 2015-06-27 DIAGNOSIS — Z8759 Personal history of other complications of pregnancy, childbirth and the puerperium: Secondary | ICD-10-CM | POA: Diagnosis not present

## 2015-06-27 DIAGNOSIS — Z331 Pregnant state, incidental: Secondary | ICD-10-CM | POA: Diagnosis not present

## 2015-07-05 DIAGNOSIS — E039 Hypothyroidism, unspecified: Secondary | ICD-10-CM | POA: Diagnosis not present

## 2015-08-03 DIAGNOSIS — Z3481 Encounter for supervision of other normal pregnancy, first trimester: Secondary | ICD-10-CM | POA: Diagnosis not present

## 2015-08-03 DIAGNOSIS — Z3482 Encounter for supervision of other normal pregnancy, second trimester: Secondary | ICD-10-CM | POA: Diagnosis not present

## 2015-08-03 DIAGNOSIS — E039 Hypothyroidism, unspecified: Secondary | ICD-10-CM | POA: Diagnosis not present

## 2015-08-03 DIAGNOSIS — Z1379 Encounter for other screening for genetic and chromosomal anomalies: Secondary | ICD-10-CM | POA: Diagnosis not present

## 2015-08-03 DIAGNOSIS — Z124 Encounter for screening for malignant neoplasm of cervix: Secondary | ICD-10-CM | POA: Diagnosis not present

## 2015-08-03 DIAGNOSIS — Z3A14 14 weeks gestation of pregnancy: Secondary | ICD-10-CM | POA: Diagnosis not present

## 2015-08-03 DIAGNOSIS — R300 Dysuria: Secondary | ICD-10-CM | POA: Diagnosis not present

## 2015-08-03 DIAGNOSIS — Z3A12 12 weeks gestation of pregnancy: Secondary | ICD-10-CM | POA: Diagnosis not present

## 2015-08-28 DIAGNOSIS — Z3A16 16 weeks gestation of pregnancy: Secondary | ICD-10-CM | POA: Diagnosis not present

## 2015-08-28 DIAGNOSIS — Z3482 Encounter for supervision of other normal pregnancy, second trimester: Secondary | ICD-10-CM | POA: Diagnosis not present

## 2015-09-10 ENCOUNTER — Emergency Department: Payer: 59

## 2015-09-10 ENCOUNTER — Encounter: Payer: Self-pay | Admitting: Emergency Medicine

## 2015-09-10 ENCOUNTER — Emergency Department
Admission: EM | Admit: 2015-09-10 | Discharge: 2015-09-10 | Disposition: A | Discharge status: Home / Self Care | Payer: 59 | Source: Home / Self Care | Attending: Emergency Medicine | Admitting: Emergency Medicine

## 2015-09-10 DIAGNOSIS — R600 Localized edema: Secondary | ICD-10-CM | POA: Diagnosis not present

## 2015-09-10 DIAGNOSIS — M25472 Effusion, left ankle: Secondary | ICD-10-CM

## 2015-09-10 NOTE — Discharge Instructions (Signed)
Ankle Sprain  An ankle sprain is an injury to the strong, fibrous tissues (ligaments) that hold the bones of your ankle joint together.   CAUSES  An ankle sprain is usually caused by a fall or by twisting your ankle. Ankle sprains most commonly occur when you step on the outer edge of your foot, and your ankle turns inward. People who participate in sports are more prone to these types of injuries.   SYMPTOMS    Pain in your ankle. The pain may be present at rest or only when you are trying to stand or walk.   Swelling.   Bruising. Bruising may develop immediately or within 1 to 2 days after your injury.   Difficulty standing or walking, particularly when turning corners or changing directions.  DIAGNOSIS   Your caregiver will ask you details about your injury and perform a physical exam of your ankle to determine if you have an ankle sprain. During the physical exam, your caregiver will press on and apply pressure to specific areas of your foot and ankle. Your caregiver will try to move your ankle in certain ways. An X-ray exam may be done to be sure a bone was not broken or a ligament did not separate from one of the bones in your ankle (avulsion fracture).   TREATMENT   Certain types of braces can help stabilize your ankle. Your caregiver can make a recommendation for this. Your caregiver may recommend the use of medicine for pain. If your sprain is severe, your caregiver may refer you to a surgeon who helps to restore function to parts of your skeletal system (orthopedist) or a physical therapist.  HOME CARE INSTRUCTIONS    Apply ice to your injury for 1-2 days or as directed by your caregiver. Applying ice helps to reduce inflammation and pain.    Put ice in a plastic bag.    Place a towel between your skin and the bag.    Leave the ice on for 15-20 minutes at a time, every 2 hours while you are awake.   Only take over-the-counter or prescription medicines for pain, discomfort, or fever as directed by  your caregiver.   Elevate your injured ankle above the level of your heart as much as possible for 2-3 days.   If your caregiver recommends crutches, use them as instructed. Gradually put weight on the affected ankle. Continue to use crutches or a cane until you can walk without feeling pain in your ankle.   If you have a plaster splint, wear the splint as directed by your caregiver. Do not rest it on anything harder than a pillow for the first 24 hours. Do not put weight on it. Do not get it wet. You may take it off to take a shower or bath.   You may have been given an elastic bandage to wear around your ankle to provide support. If the elastic bandage is too tight (you have numbness or tingling in your foot or your foot becomes cold and blue), adjust the bandage to make it comfortable.   If you have an air splint, you may blow more air into it or let air out to make it more comfortable. You may take your splint off at night and before taking a shower or bath. Wiggle your toes in the splint several times per day to decrease swelling.  SEEK MEDICAL CARE IF:    You have rapidly increasing bruising or swelling.   Your toes feel   extremely cold or you lose feeling in your foot.   Your pain is not relieved with medicine.  SEEK IMMEDIATE MEDICAL CARE IF:   Your toes are numb or blue.   You have severe pain that is increasing.  MAKE SURE YOU:    Understand these instructions.   Will watch your condition.   Will get help right away if you are not doing well or get worse.     This information is not intended to replace advice given to you by your health care provider. Make sure you discuss any questions you have with your health care provider.     Document Released: 04/28/2005 Document Revised: 05/19/2014 Document Reviewed: 05/10/2011  Elsevier Interactive Patient Education 2016 Elsevier Inc.

## 2015-09-10 NOTE — ED Notes (Signed)
Left ankle pain since yesterday, [place] 8 [place].  [redacted] weeks pregnant. Denies trauma

## 2015-09-10 NOTE — ED Provider Notes (Signed)
CSN: [id number]     Arrival date & time 09/10/15  1220 History   First MD Initiated Contact with Patient 09/10/15 1239     Chief Complaint  Patient presents with  . Ankle Pain   (Consider location/radiation/quality/duration/timing/severity/associated sxs/prior Treatment) Patient is a 25 y.o. female presenting with ankle pain. The history is provided by the patient. No language interpreter was used.  Ankle Pain Location:  Ankle Time since incident:  1 day Injury: no   Ankle location:  L ankle Pain details:    Quality:  Unable to specify   Radiates to:  Does not radiate   Severity:  Mild   Onset quality:  Sudden   Duration:  1 day   Timing:  Constant   Progression:  Worsening Chronicity:  New Dislocation: no   Prior injury to area:  No Worsened by:  Nothing tried Ineffective treatments:  None tried Associated symptoms: swelling   Associated symptoms: no itching and no numbness   Risk factors: no concern for non-accidental trauma   Pt flew from  [place] and drove 8 hours. Pt reports she noted foot swelling today.  Swelling just to left ankle.  Pt is pregnant.  Pt is worried about dvt.  Past Medical History  Diagnosis Date  . Thyroid disease   . Hypothyroidism   . Hypolipidemia   . Allergy   . Anxiety    Past Surgical History  Procedure Laterality Date  . Cholecystectomy  2009  . Cholecystectomy     Family History  Problem Relation Age of Onset  . Thyroid disease Mother   . Mental retardation Mother     depression  . Hypertension Father   . Thyroid disease Maternal Aunt   . Diabetes Maternal Grandmother   . Diabetes Maternal Grandfather   . Heart disease Maternal Grandfather   . Cancer Paternal Grandmother     lung   Social History  Substance Use Topics  . Smoking status: Never Smoker   . Smokeless tobacco: None  . Alcohol Use: 1.2 oz/week    2 Glasses of wine per week   OB History    Gravida Para Term Preterm AB TAB SAB Ectopic Multiple Living   1 0 0 0  0 0 0 0  0     Review of Systems  Skin: Negative for itching.    Allergies  Other  Home Medications   Prior to Admission medications   Medication Sig Start Date End Date Taking? Authorizing Provider  oxyCODONE-acetaminophen (PERCOCET/ROXICET) 5-325 MG tablet Take 2 tablets by mouth once. 02/24/15   Venus Standard, CNM  Prenatal Vit-Fe Fumarate-FA (MULTIVITAMIN-PRENATAL) 27-0.8 MG TABS tablet Take 1 tablet by mouth daily at 12 noon.    Historical Provider, MD   Meds Ordered and Administered this Visit  Medications - No data to display  BP 132/87 mmHg  Pulse 114  Temp(Src) 98.4 F (36.9 C) (Oral)  Ht  (1.651 m)  Wt 191 lb (86.637 kg)  BMI 31.78 kg/m2  SpO2 97%  LMP 11/27/2013 No data found.   Physical Exam  Constitutional: She is oriented to person, place, and time. She appears well-developed and well-nourished.  HENT:  Head: Normocephalic.  Eyes: EOM are normal.  Neck: Normal range of motion.  Cardiovascular: Normal rate.   Pulmonary/Chest: Effort normal.  Abdominal: Soft. She exhibits no distension.  Musculoskeletal: Normal range of motion.  Neurological: She is alert and oriented to person, place, and time.  Skin: Skin is warm.  Psychiatric: She  has a normal mood and affect.  Nursing note and vitals reviewed.   ED Course  Procedures (including critical care time)  Labs Review Labs Reviewed - No data to display  Imaging Review [place] Venous Img Lower Unilateral Left  09/10/2015  CLINICAL DATA:  Left ankle pain and edema. EXAM: LEFT LOWER EXTREMITY VENOUS DOPPLER ULTRASOUND TECHNIQUE: Gray-scale sonography with graded compression, as well as color Doppler and duplex ultrasound were performed to evaluate the lower extremity deep venous systems from the level of the common femoral vein and including the common femoral, femoral, profunda femoral, popliteal and calf veins including the posterior tibial, peroneal and gastrocnemius veins when visible. The superficial  great saphenous vein was also interrogated. Spectral Doppler was utilized to evaluate flow at rest and with distal augmentation maneuvers in the common femoral, femoral and popliteal veins. COMPARISON:  None. FINDINGS: Contralateral Common Femoral Vein: Respiratory phasicity is normal and symmetric with the symptomatic side. No evidence of thrombus. Normal compressibility. Common Femoral Vein: No evidence of thrombus. Normal compressibility, respiratory phasicity and response to augmentation. Saphenofemoral Junction: No evidence of thrombus. Normal compressibility and flow on color Doppler imaging. Profunda Femoral Vein: No evidence of thrombus. Normal compressibility and flow on color Doppler imaging. Femoral Vein: No evidence of thrombus. Normal compressibility, respiratory phasicity and response to augmentation. Popliteal Vein: No evidence of thrombus. Normal compressibility, respiratory phasicity and response to augmentation. Calf Veins: No evidence of thrombus. Normal compressibility and flow on color Doppler imaging. Superficial Great Saphenous Vein: No evidence of thrombus. Normal compressibility and flow on color Doppler imaging. Venous Reflux:  None. Other Findings: No evidence of superficial thrombophlebitis or abnormal fluid collection. IMPRESSION: No evidence of left lower extremity deep venous thrombosis. Electronically Signed   By: Irish LackGlenn  Yamagata M.D.   On: 09/10/2015 13:40     Visual Acuity Review  Right Eye Distance:   Left Eye Distance:   Bilateral Distance:    Right Eye Near:   Left Eye Near:    Bilateral Near:         MDM Pt advised of results.  Pt advised to follow up with her ObGyn for recheck    1. Ankle swelling, left    No orders of the defined types were placed in this encounter.   An After Visit Summary was printed and given to the patient.   Lonia SkinnerLeslie K EncinalSofia, PA-C 09/10/15 1555

## 2015-09-25 DIAGNOSIS — E039 Hypothyroidism, unspecified: Secondary | ICD-10-CM | POA: Diagnosis not present

## 2015-09-26 MED FILL — LEVOTHYROXINE 88 MCG TABLET: 88 | 90 days supply | Qty: 90 | Fill #0

## 2015-09-27 DIAGNOSIS — Z3A20 20 weeks gestation of pregnancy: Secondary | ICD-10-CM | POA: Diagnosis not present

## 2015-09-27 DIAGNOSIS — Z3481 Encounter for supervision of other normal pregnancy, first trimester: Secondary | ICD-10-CM | POA: Diagnosis not present

## 2015-09-27 DIAGNOSIS — Z36 Encounter for antenatal screening of mother: Secondary | ICD-10-CM | POA: Diagnosis not present

## 2015-09-27 DIAGNOSIS — Z3482 Encounter for supervision of other normal pregnancy, second trimester: Secondary | ICD-10-CM | POA: Diagnosis not present

## 2015-12-30 ENCOUNTER — Encounter (HOSPITAL_COMMUNITY): Payer: Self-pay

## 2016-08-11 IMAGING — US US OB TRANSVAGINAL
1 series · 15 of 28 positions shown · non-contrast
Comparison: None.

CLINICAL DATA: Pregnant patient with vaginal bleeding. [REDACTED]
weeks and 4 days pregnant based on her last menstrual period.

EXAM:
OBSTETRIC <14 WK US AND TRANSVAGINAL OB US
TECHNIQUE: Both transabdominal and transvaginal ultrasound examinations were
performed for complete evaluation of the gestation as well as the
maternal uterus, adnexal regions, and pelvic cul-de-sac.
Transvaginal technique was performed to assess early pregnancy.

[Series 1: us ob transvaginal · 15 of 36 slices shown]
[im 1/36]
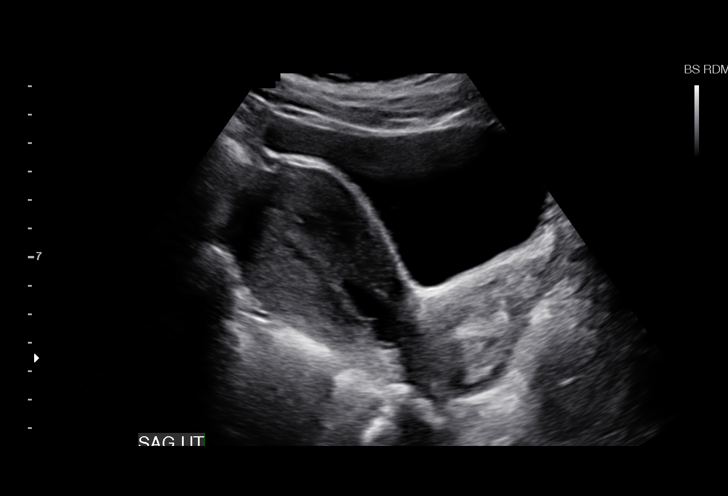
[im 3/36]
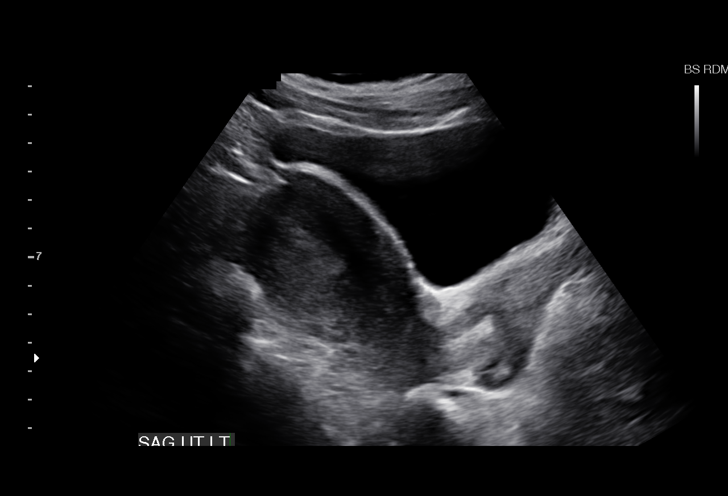
[im 6/36]
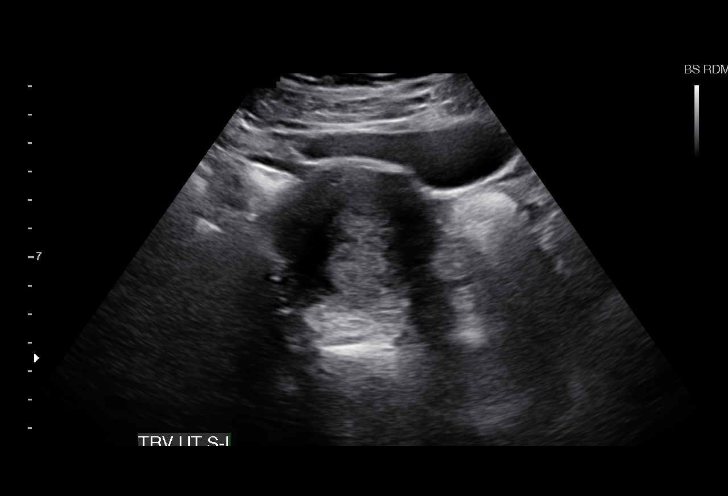
[im 8/36]
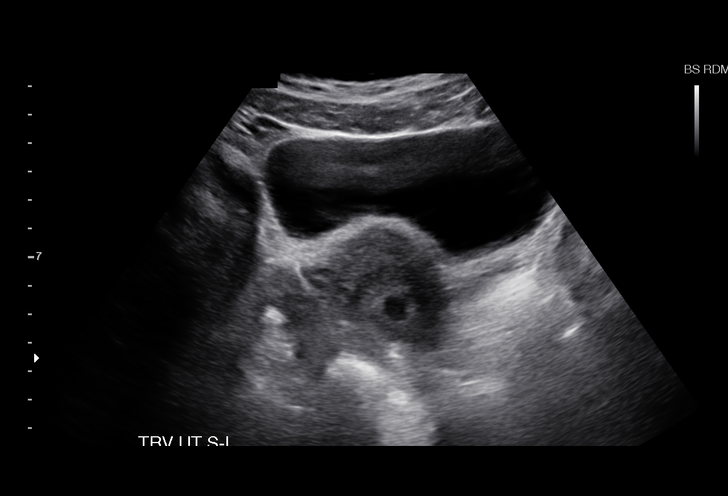
[im 11/36]
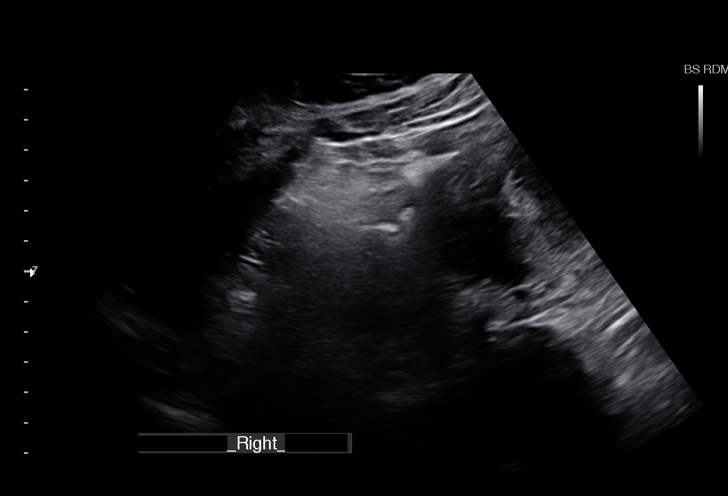
[im 13/36]
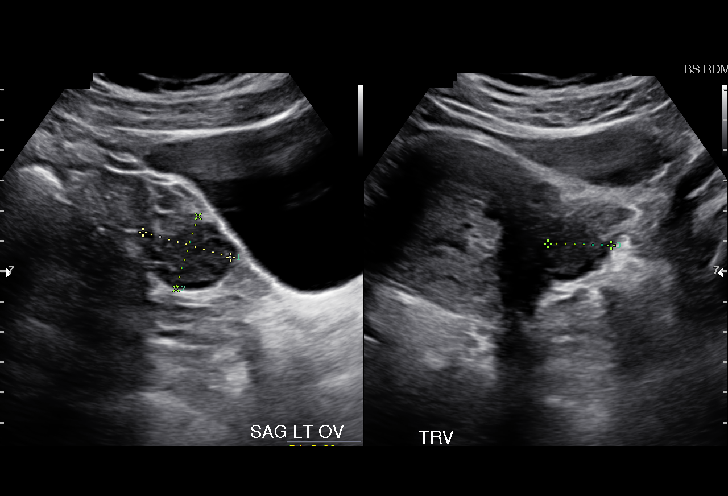
[im 16/36]
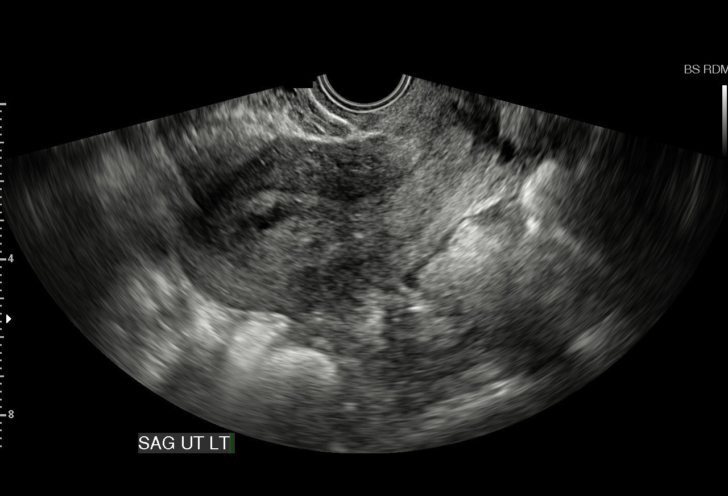
[im 19/36]
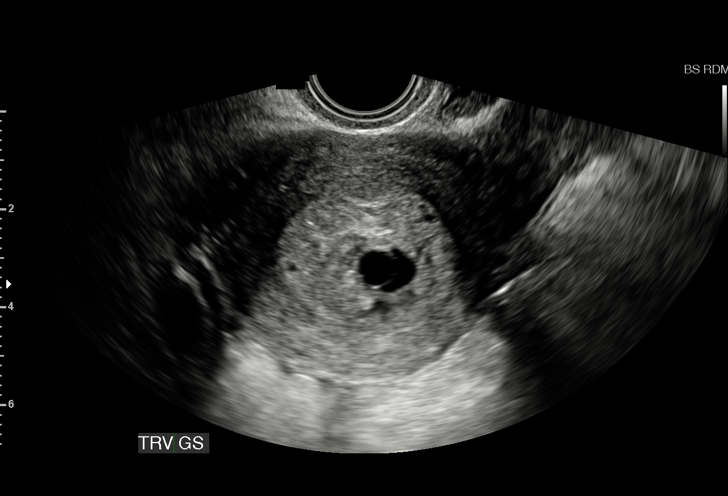
[im 20/36]
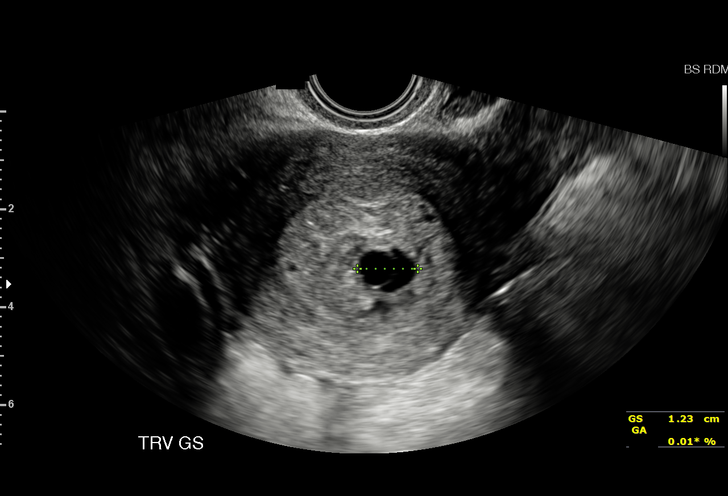
[im 23/36]
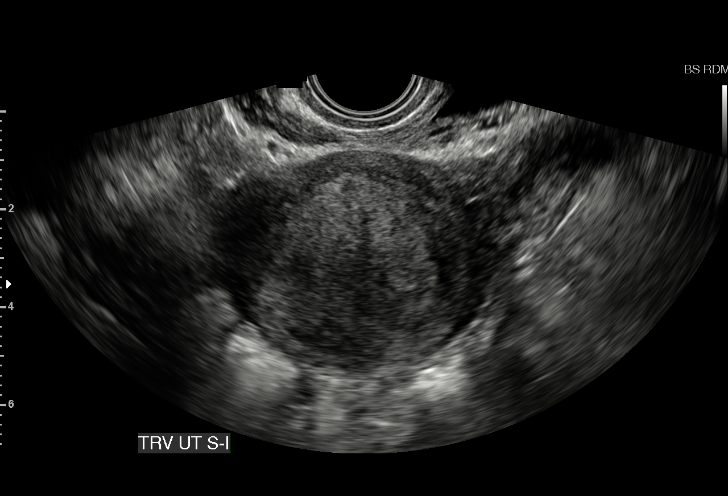
[im 25/36]
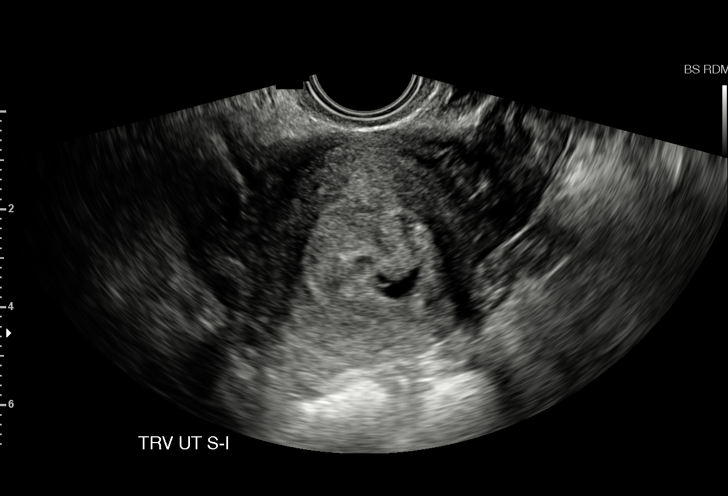
[im 28/36]
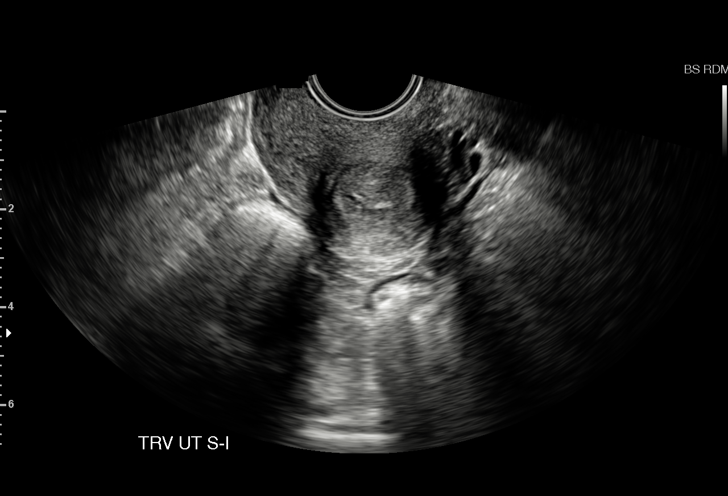
[im 30/36]
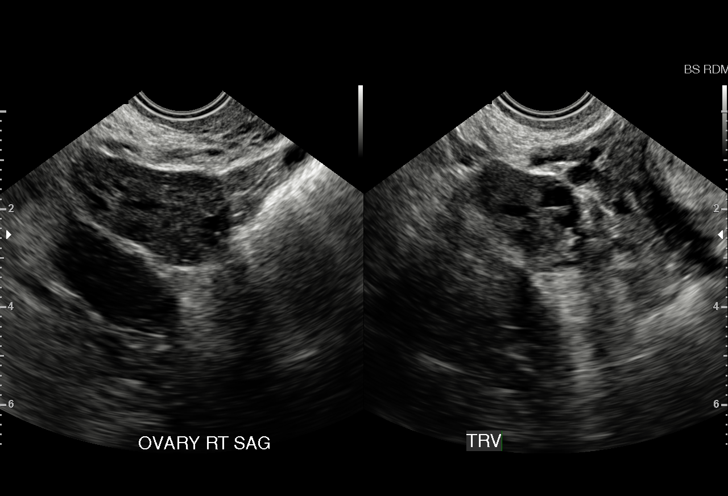
[im 33/36]
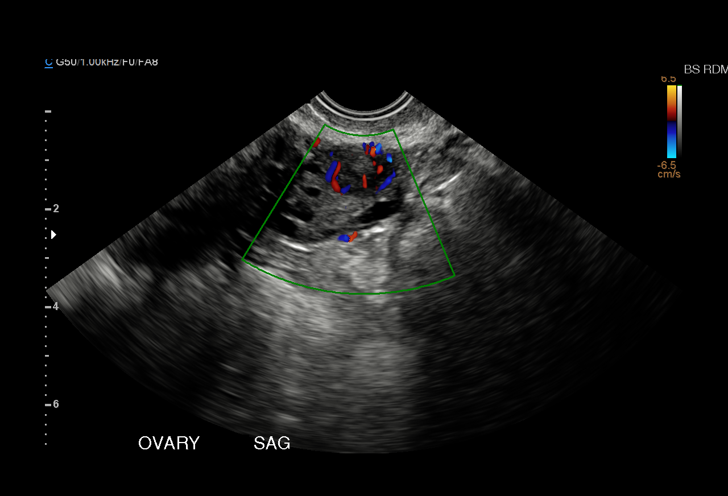
[im 36/36]
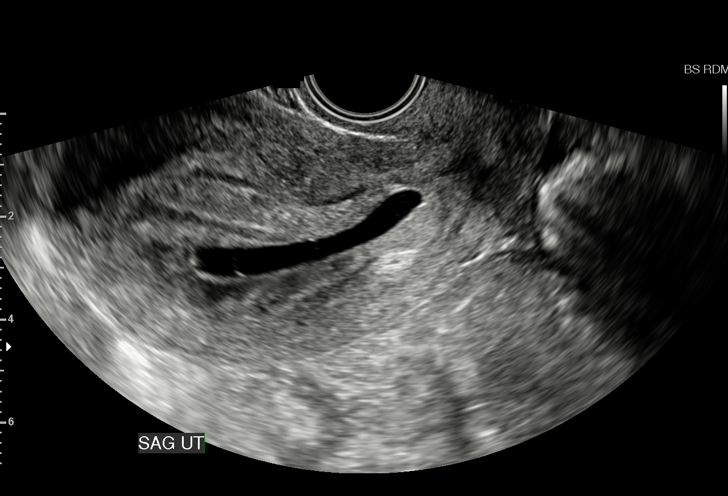

[15 of 28 positions shown; findings below may reference images not displayed]

FINDINGS: Intrauterine gestational sac: Elongated gestational sac noted in the
uterus extending from the mid uterine segment to just above the
endocervix. There is a thin amnion within the gestational sac.

Yolk sac:  No

Embryo:  No

MSD: 21.5  mm   7 w   1  d

Maternal uterus/adnexae: There is evidence of a small subchronic
hemorrhage. No uterine masses. Cervix is normal in length and
closed. Normal ovaries. No adnexal masses. No pelvic free fluid.
IMPRESSION: 1. Abnormal elongated gestational sac lies within the uterus
containing no yolk sac or embryo. Findings are suspicious but not
yet definitive for failed pregnancy. Recommend follow-up US in 10-14
days for definitive diagnosis. This recommendation follows SRU
consensus guidelines: Diagnostic Criteria for Nonviable Pregnancy
Early in the First Trimester. N Engl J Med 2231; [DATE].
2. Small subchronic hemorrhage.
3. No uterine masses.  Normal ovaries and adnexa.

## 2018-02-25 ENCOUNTER — Ambulatory Visit: Payer: Self-pay | Admitting: Psychiatry
